# Patient Record
Sex: Female | Born: 1940 | Race: White | Hispanic: No | Marital: Married | Smoking: Never smoker
Health system: Southern US, Community
[De-identification: ages and names within clinical notes are randomized; demographics above are authoritative.]

## PROBLEM LIST (undated history)

## (undated) DIAGNOSIS — E119 Type 2 diabetes mellitus without complications: Secondary | ICD-10-CM

## (undated) DIAGNOSIS — E785 Hyperlipidemia, unspecified: Secondary | ICD-10-CM

---

## 2017-01-11 ENCOUNTER — Emergency Department (INDEPENDENT_AMBULATORY_CARE_PROVIDER_SITE_OTHER)
Admission: EM | Admit: 2017-01-11 | Discharge: 2017-01-11 | Disposition: A | Discharge status: Home / Self Care | Payer: Medicare Other | Source: Home / Self Care | Attending: Family Medicine | Admitting: Family Medicine

## 2017-01-11 ENCOUNTER — Emergency Department (INDEPENDENT_AMBULATORY_CARE_PROVIDER_SITE_OTHER): Payer: Medicare Other

## 2017-01-11 ENCOUNTER — Encounter: Payer: Self-pay | Admitting: Emergency Medicine

## 2017-01-11 DIAGNOSIS — R69 Illness, unspecified: Secondary | ICD-10-CM | POA: Diagnosis not present

## 2017-01-11 DIAGNOSIS — R590 Localized enlarged lymph nodes: Secondary | ICD-10-CM | POA: Diagnosis not present

## 2017-01-11 DIAGNOSIS — R05 Cough: Secondary | ICD-10-CM

## 2017-01-11 DIAGNOSIS — J111 Influenza due to unidentified influenza virus with other respiratory manifestations: Secondary | ICD-10-CM

## 2017-01-11 HISTORY — DX: Hyperlipidemia, unspecified: E78.5

## 2017-01-11 HISTORY — DX: Type 2 diabetes mellitus without complications: E11.9

## 2017-01-11 MED ORDER — GUAIFENESIN-CODEINE 100-10 MG/5ML PO SOLN: ORAL | 0 refills | Status: AC

## 2017-01-11 MED ORDER — OSELTAMIVIR PHOSPHATE 75 MG PO CAPS: 75.0000 mg | ORAL_CAPSULE | Freq: Two times a day (BID) | ORAL | 0 refills | Status: AC

## 2017-01-11 NOTE — ED Triage Notes (Signed)
Cough, chest hurts, fever, fatigue x 1.5 days

## 2017-01-11 NOTE — ED Provider Notes (Signed)
[facility]    CSN: [id number] Arrival date & time: 01/11/17  0945     History   Chief Complaint Chief Complaint  Patient presents with  . Influenza    HPI Meghan Hernandez is a 76 y.o. female.   Complains of 2 day history flu-like illness including myalgias, headache, fever/chills, fatigue, and cough.  Also has mild nasal congestion and sore throat.  Cough is non-productive and somewhat worse at night.  No pleuritic pain or shortness of breath.   She has a past history of breast cancer. She has a past history of pneumonia 2007, and she states that she feels as bad now as then.   The history is provided by the patient and the spouse.    Past Medical History:  Diagnosis Date  . Diabetes mellitus without complication (HCC)   . Hyperlipidemia     There are no active problems to display for this patient.   History reviewed. No pertinent surgical history.  OB History    No data available       Home Medications    Prior to Admission medications   Medication Sig Start Date End Date Taking? Authorizing Provider  Ascorbic Acid (VITAMIN C) 100 MG tablet Take 100 mg by mouth daily.   Yes Historical Provider, MD  aspirin 81 MG chewable tablet Chew by mouth daily.   Yes Historical Provider, MD  cholecalciferol (VITAMIN D) 1000 units tablet Take 1,000 Units by mouth daily.   Yes Historical Provider, MD  gabapentin (NEURONTIN) 100 MG capsule Take 100 mg by mouth 3 (three) times daily.   Yes Historical Provider, MD  glipiZIDE (GLUCOTROL) 10 MG tablet Take 10 mg by mouth daily before breakfast.   Yes Historical Provider, MD  losartan-hydrochlorothiazide (HYZAAR) 100-12.5 MG tablet Take 1 tablet by mouth daily.   Yes Historical Provider, MD  metFORMIN (GLUCOPHAGE) 1000 MG tablet Take 1,000 mg by mouth 2 (two) times daily with a meal.   Yes Historical Provider, MD  simvastatin (ZOCOR) 10 MG tablet Take 10 mg by mouth daily.   Yes Historical Provider, MD  oseltamivir  (TAMIFLU) 75 MG capsule Take 1 capsule (75 mg total) by mouth every 12 (twelve) hours. 01/11/17   Lattie Haw, MD    Family History No family history on file.  Social History Social History  Substance Use Topics  . Smoking status: Never Smoker  . Smokeless tobacco: Never Used  . Alcohol use No     Allergies   Lipitor [atorvastatin]   Review of Systems Review of Systems + sore throat + cough No pleuritic pain No wheezing + nasal congestion + post-nasal drainage No sinus pain/pressure No itchy/red eyes No earache No hemoptysis No SOB + fever, + chills No nausea No vomiting No abdominal pain No diarrhea No urinary symptoms No skin rash + fatigue + myalgias + headache Used OTC meds without relief   Physical Exam Triage Vital Signs ED Triage Vitals  Enc Vitals Group     BP 01/11/17 1107 132/84     Pulse Rate 01/11/17 1107 92     Resp --      Temp 01/11/17 1107 98.9 F (37.2 C)     Temp Source 01/11/17 1107 Oral     SpO2 01/11/17 1107 95 %     Weight 01/11/17 1107 182 lb (82.6 kg)     Height 01/11/17 1107 5\' 1"  (1.549 m)     Head Circumference --      Peak  Flow --      Pain Score 01/11/17 1112 2     Pain Loc --      Pain Edu? --      Excl. in GC? --    No data found.   Updated Vital Signs BP 132/84 (BP Location: Left Arm)   Pulse 92   Temp 98.9 F (37.2 C) (Oral)   Ht 5\' 1"  (1.549 m)   Wt 182 lb (82.6 kg)   SpO2 95%   BMI 34.39 kg/m   Visual Acuity Right Eye Distance:   Left Eye Distance:   Bilateral Distance:    Right Eye Near:   Left Eye Near:    Bilateral Near:     Physical Exam Nursing notes and Vital Signs reviewed. Appearance:  Patient appears stated age, and in no acute distress Eyes:  Pupils are equal, round, and reactive to light and accomodation.  Extraocular movement is intact.  Conjunctivae are not inflamed  Ears:  Canals normal.  Tympanic membranes normal.  Nose:  Mildly congested turbinates.  No sinus tenderness.      Pharynx:  Normal Neck:  Supple.  Tender enlarged posterior/lateral nodes are palpated bilaterally  Lungs:  Clear to auscultation.  Breath sounds are equal.  Moving air well. Chest:  Distinct tenderness to palpation over the mid-sternum.  Heart:  Regular rate and rhythm without murmurs, rubs, or gallops.  Abdomen:  Nontender without masses or hepatosplenomegaly.  Bowel sounds are present.  No CVA or flank tenderness.  Extremities:  No edema.  Skin:  No rash present.    UC Treatments / Results  Labs (all labs ordered are listed, but only abnormal results are displayed) Labs Reviewed - No data to display  EKG  EKG Interpretation None       Radiology Dg Chest 2 View  Result Date: 01/11/2017 CLINICAL DATA:  Body aches and cough.  Flu-like illness. EXAM: CHEST  2 VIEW COMPARISON:  None FINDINGS: Lobulated appearance of the hila in the frontal projection with fullness laterally. No evidence of mediastinal adenopathy. Normal heart size and mediastinal contours. Negative for pneumonia, edema, effusion, or pneumothorax. IMPRESSION: 1. Negative for pneumonia. 2. Borderline hilar adenopathy. Recommend repeat after resolution of the patient's acute illness. Additionally, if there is available outside imaging would gladly compare and addend this report. Electronically Signed   By: Marnee SpringJonathon  Watts M.D.   On: 01/11/2017 11:52    Procedures Procedures (including critical care time)  Medications Ordered in UC Medications - No data to display   Initial Impression / Assessment and Plan / UC Course  I have reviewed the triage vital signs and the nursing notes.  Pertinent labs & imaging results that were available during my care of the patient were reviewed by me and considered in my medical decision making (see chart for details).    Begin Tamiflu With patient's past history of breast cancer, recommend that she follow-up with her oncologist to evaluate today's chest X-ray. Take plain  guaifenesin (1200mg  extended release tabs such as Mucinex) twice daily, with plenty of water, for cough and congestion.  Get adequate rest.   Also recommend using saline nasal spray several times daily and saline nasal irrigation (AYR is a common brand).  Try warm salt water gargles for sore throat.  Stop all antihistamines for now, and other non-prescription cough/cold preparations. May take Delsym Cough Suppressant at bedtime for nighttime cough.  Followup with Family Doctor if not improved in 5 days.  Final Clinical Impressions(s) / UC Diagnoses   Final diagnoses:  Influenza-like illness    New Prescriptions New Prescriptions   OSELTAMIVIR (TAMIFLU) 75 MG CAPSULE    Take 1 capsule (75 mg total) by mouth every 12 (twelve) hours.     Lattie Haw, MD 01/11/17 1228

## 2017-01-11 NOTE — Discharge Instructions (Signed)
Take plain guaifenesin (1200mg  extended release tabs such as Mucinex) twice daily, with plenty of water, for cough and congestion.  Get adequate rest.   Also recommend using saline nasal spray several times daily and saline nasal irrigation (AYR is a common brand).  Try warm salt water gargles for sore throat.  Stop all antihistamines for now, and other non-prescription cough/cold preparations. May take Delsym Cough Suppressant at bedtime for nighttime cough.    Followup with your oncologist to evaluate today's chest X-rays.

## 2017-10-13 IMAGING — DX DG CHEST 2V
2 series · 2 of 2 positions shown · non-contrast
Comparison: None

CLINICAL DATA: Body aches and cough.  Flu-like illness.

EXAM:
CHEST  2 VIEW

[chest pa]
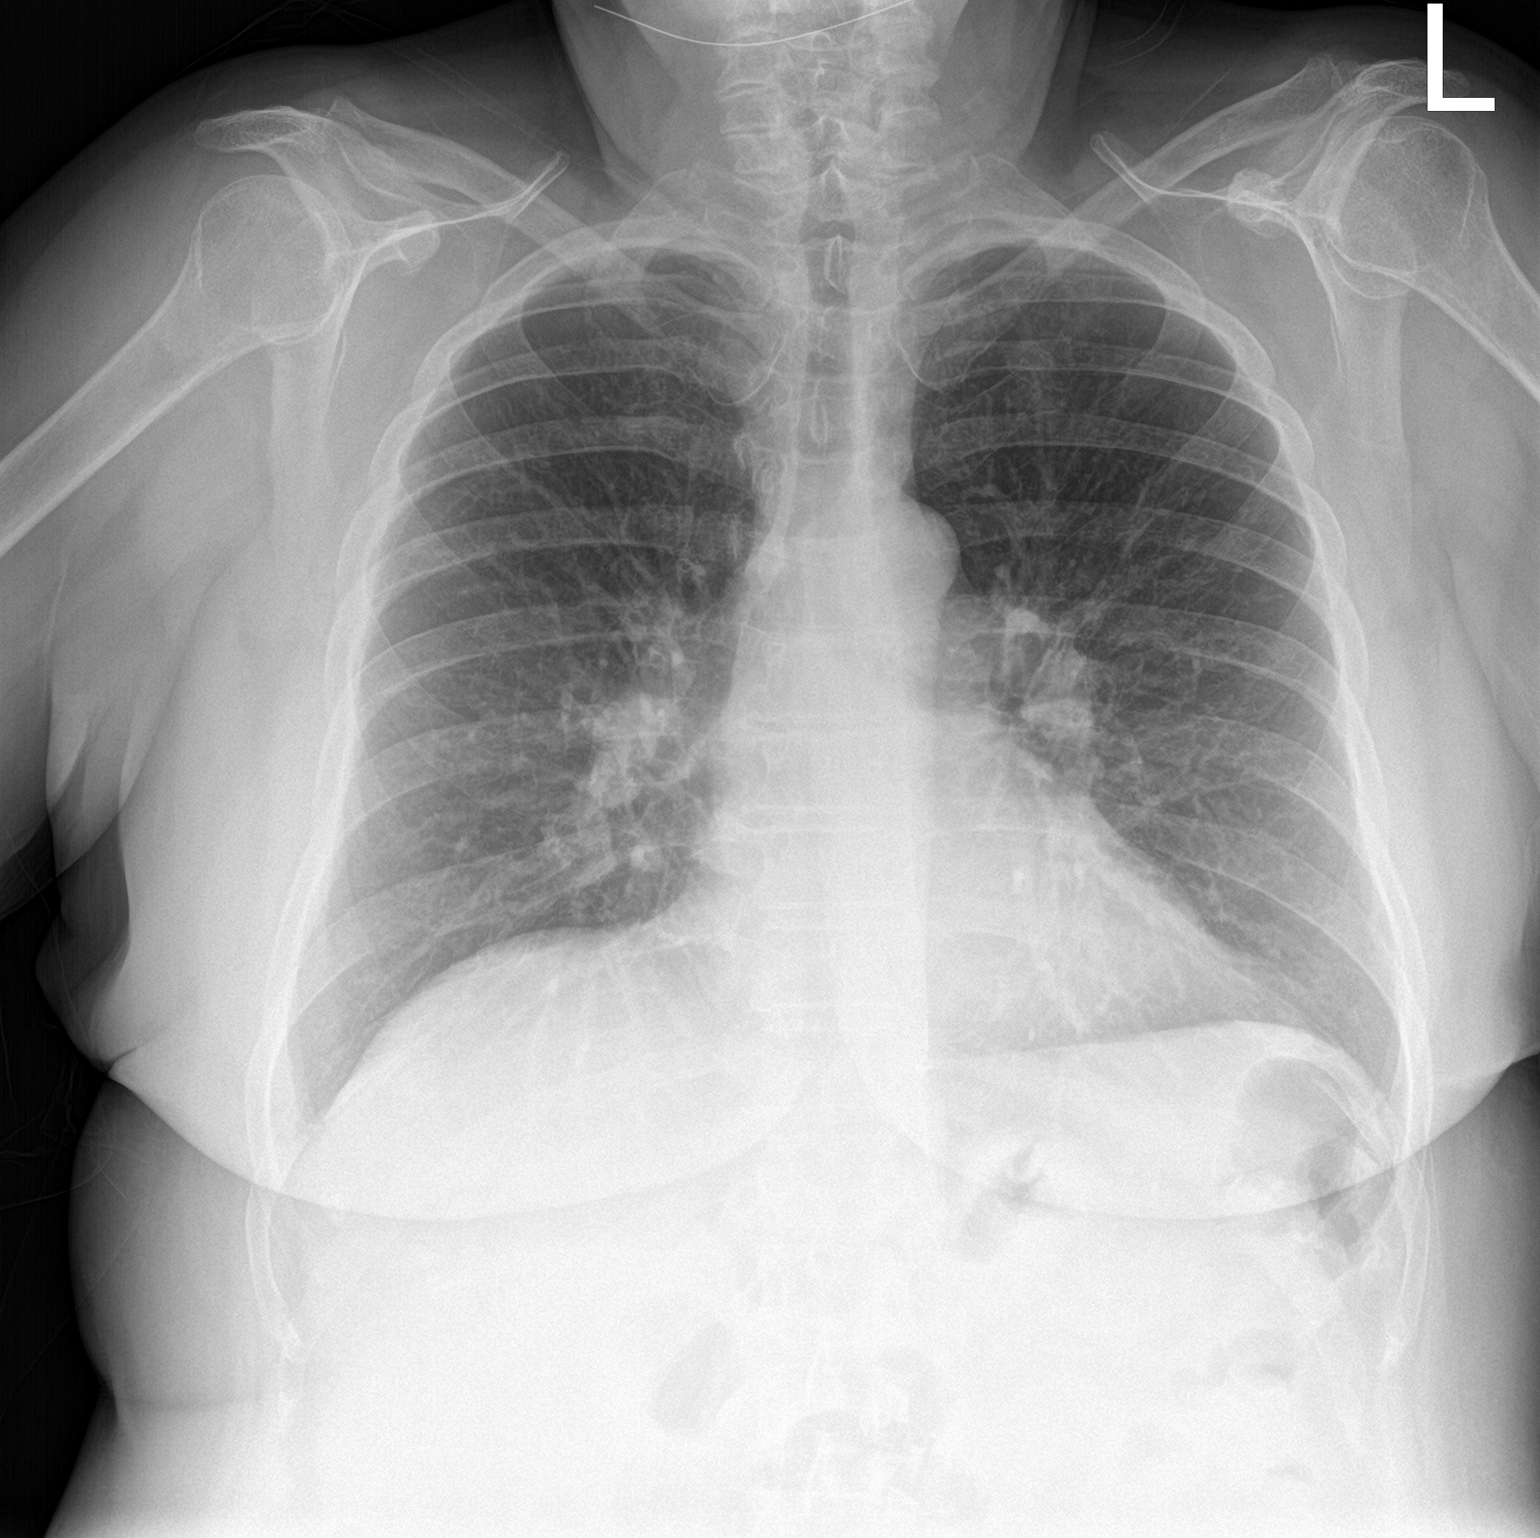

[chest lat]
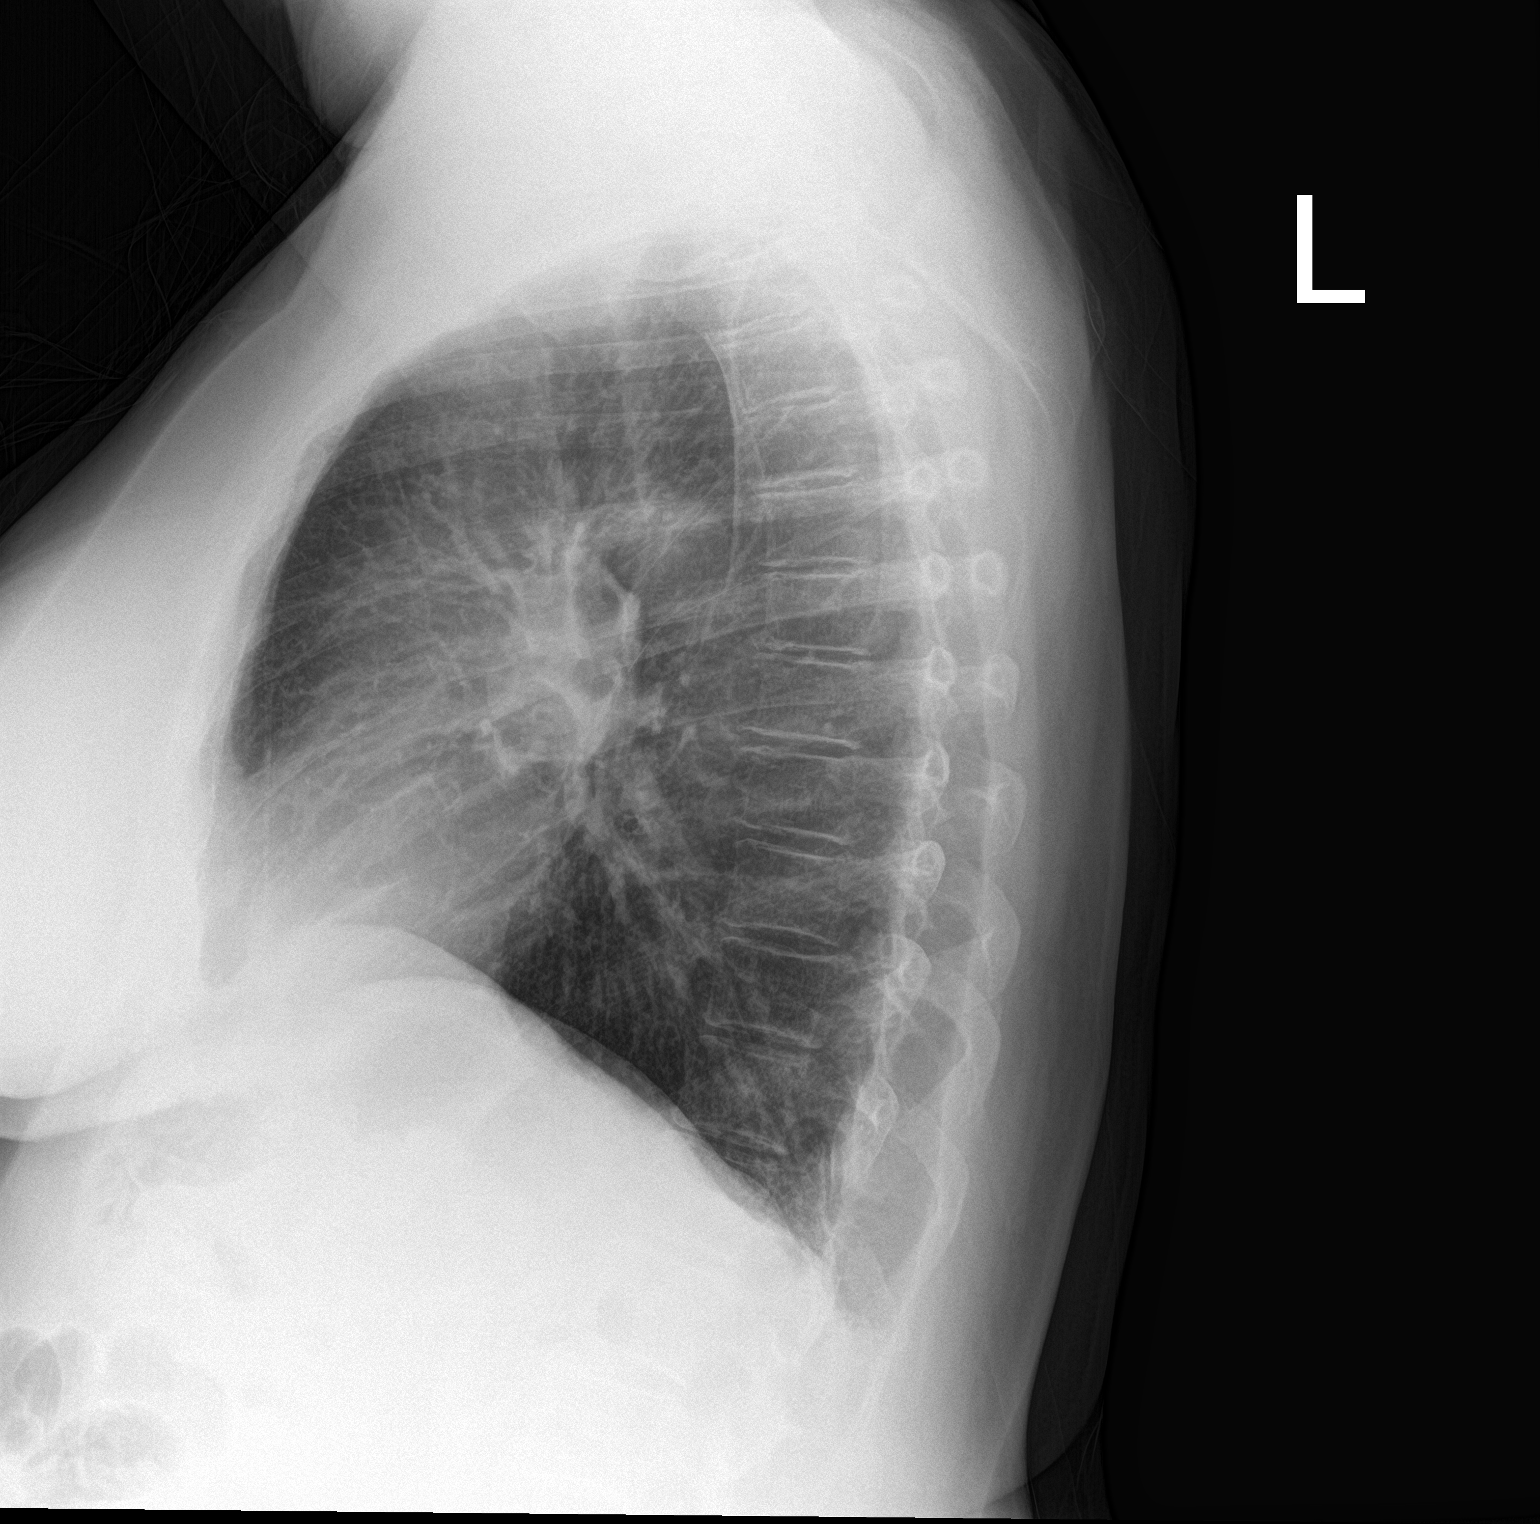

[2 of 2 positions shown; findings below may reference images not displayed]

FINDINGS: Lobulated appearance of the hila in the frontal projection with
fullness laterally. No evidence of mediastinal adenopathy. Normal
heart size and mediastinal contours. Negative for pneumonia, edema,
effusion, or pneumothorax.
IMPRESSION: 1. Negative for pneumonia.
2. Borderline hilar adenopathy. Recommend repeat after resolution of
the patient's acute illness. Additionally, if there is available
outside imaging would gladly compare and addend this report.
